# Patient Record
Sex: Male | Born: 2000 | Race: White | Hispanic: No | Marital: Single | Smoking: Never smoker
Health system: Southern US, Community
[De-identification: ages and names within clinical notes are randomized; demographics above are authoritative.]

---

## 2018-03-22 ENCOUNTER — Emergency Department: Payer: BLUE CROSS/BLUE SHIELD

## 2018-03-22 ENCOUNTER — Encounter: Payer: Self-pay | Admitting: *Deleted

## 2018-03-22 ENCOUNTER — Emergency Department
Admission: EM | Admit: 2018-03-22 | Discharge: 2018-03-22 | Disposition: A | Discharge status: Home / Self Care | Payer: BLUE CROSS/BLUE SHIELD | Attending: Emergency Medicine | Admitting: Emergency Medicine

## 2018-03-22 ENCOUNTER — Other Ambulatory Visit: Payer: Self-pay

## 2018-03-22 DIAGNOSIS — Y999 Unspecified external cause status: Secondary | ICD-10-CM | POA: Diagnosis not present

## 2018-03-22 DIAGNOSIS — Y9362 Activity, american flag or touch football: Secondary | ICD-10-CM | POA: Insufficient documentation

## 2018-03-22 DIAGNOSIS — Y92321 Football field as the place of occurrence of the external cause: Secondary | ICD-10-CM | POA: Insufficient documentation

## 2018-03-22 DIAGNOSIS — S99911A Unspecified injury of right ankle, initial encounter: Secondary | ICD-10-CM | POA: Diagnosis present

## 2018-03-22 DIAGNOSIS — X500XXA Overexertion from strenuous movement or load, initial encounter: Secondary | ICD-10-CM | POA: Insufficient documentation

## 2018-03-22 DIAGNOSIS — S93491A Sprain of other ligament of right ankle, initial encounter: Secondary | ICD-10-CM | POA: Insufficient documentation

## 2018-03-22 MED ORDER — MELOXICAM 15 MG PO TABS: 15.0000 mg | ORAL_TABLET | Freq: Every day | ORAL | 1 refills | Status: AC

## 2018-03-22 NOTE — ED Triage Notes (Signed)
Pt has right ankle pain.  Pt injured ankle today playing flag football.  Swelling to left ankle.  Pt alert.

## 2018-03-22 NOTE — ED Provider Notes (Signed)
[facility] Emergency Department Provider Note  ____________________________________________  Time seen: Approximately 10:33 PM  I have reviewed the triage vital signs and the nursing notes.   HISTORY  Chief Complaint Ankle Pain    HPI Mark Kent is a 17 y.o. male presents to the emergency department with right lateral ankle pain after sustaining an inversion type ankle injury while playing flag football.  Patient denies hitting his head during injury.  No numbness or tingling in the lower extremities.  No abrasions or lacerations.  Patient denies a history of prior right ankle sprains.  No alleviating measures have been attempted.   No past medical history on file.  There are no active problems to display for this patient.   Prior to Admission medications   Medication Sig Start Date End Date Taking? Authorizing Provider  meloxicam (MOBIC) 15 MG tablet Take 1 tablet (15 mg total) by mouth daily for 7 days. 03/22/18 03/29/18  Orvil Feil, PA-C    Allergies Patient has no known allergies.  No family history on file.  Social History Social History   Tobacco Use  . Smoking status: Never Smoker  . Smokeless tobacco: Never Used  Substance Use Topics  . Alcohol use: Never    Frequency: Never  . Drug use: Never     Review of Systems  Constitutional: No fever/chills Eyes: No visual changes. No discharge ENT: No upper respiratory complaints. Cardiovascular: no chest pain. Respiratory: no cough. No SOB. Gastrointestinal: No abdominal pain.  No nausea, no vomiting.  No diarrhea.  No constipation. Genitourinary: Negative for dysuria. No hematuria Musculoskeletal: Patient has right lateral ankle pain.  Skin: Negative for rash, abrasions, lacerations, ecchymosis. Neurological: Negative for headaches, focal weakness or numbness.   ____________________________________________   PHYSICAL EXAM:  VITAL SIGNS: ED Triage Vitals [03/22/18  2057]  Enc Vitals Group     BP 130/62     Pulse Rate (!) 106     Resp 18     Temp 98.7 F (37.1 C)     Temp Source Oral     SpO2 99 %     Weight 150 lb (68 kg)     Height 5\' 11"  (1.803 m)     Head Circumference      Peak Flow      Pain Score 8     Pain Loc      Pain Edu?      Excl. in GC?      Constitutional: Alert and oriented. Well appearing and in no acute distress. Eyes: Conjunctivae are normal. PERRL. EOMI. Head: Atraumatic. Cardiovascular: Normal rate, regular rhythm. Normal S1 and S2.  Good peripheral circulation. Respiratory: Normal respiratory effort without tachypnea or retractions. Lungs CTAB. Good air entry to the bases with no decreased or absent breath sounds. Gastrointestinal: Bowel sounds 4 quadrants. Soft and nontender to palpation. No guarding or rigidity. No palpable masses. No distention. No CVA tenderness. Musculoskeletal: Patient performs limited range of motion at the right ankle, likely secondary to pain.  Patient has significant swelling of the right lateral ankle.  Tenderness is elicited with palpation of the anterior talofibular ligament, right.  No pain with palpation over the deltoid ligament.  No pain with palpation over the calcaneus or with palpation of the right calf.  Palpable dorsalis pedis pulse, right. Neurologic:  Normal speech and language. No gross focal neurologic deficits are appreciated.  Skin:  Skin is warm, dry and intact. No rash noted. Psychiatric: Mood and affect are  normal. Speech and behavior are normal. Patient exhibits appropriate insight and judgement.   ____________________________________________   LABS (all labs ordered are listed, but only abnormal results are displayed)  Labs Reviewed - No data to display ____________________________________________  EKG   ____________________________________________  RADIOLOGY I personally viewed and evaluated these images as part of my medical decision making, as well as  reviewing the written report by the radiologist.  Dg Ankle Complete Right  Result Date: 03/22/2018 CLINICAL DATA:  Initial encounter for Pt states he was playing football and fell and another guys foot landed on his ankle. Right ankle pain and swelling. EXAM: RIGHT ANKLE - COMPLETE 3+ VIEW COMPARISON:  None. FINDINGS: Marked lateral malleolar soft tissue swelling. No acute fracture or dislocation. Base of fifth metatarsal and talar dome intact. There is soft tissue swelling extending anteriorly on the lateral view. IMPRESSION: Marked soft tissue swelling.  No acute osseous abnormality. Electronically Signed   By: Jeronimo GreavesKyle  Talbot M.D.   On: 03/22/2018 21:32    ____________________________________________    PROCEDURES  Procedure(s) performed:    Procedures    Medications - No data to display   ____________________________________________   INITIAL IMPRESSION / ASSESSMENT AND PLAN / ED COURSE  Pertinent labs & imaging results that were available during my care of the patient were reviewed by me and considered in my medical decision making (see chart for details).  Review of the  [place] was performed in accordance of the [facility] prior to dispensing any controlled drugs.      Assessment and Plan:  Right ankle sprain Patient presents to the emergency department with right lateral ankle pain after patient sustained an inversion type ankle injury.  No acute bony abnormalities were identified on x-ray examination of the right ankle.  An Ace wrap was applied in the emergency department and crutches were provided.  Rest, ice, compression and elevation were recommended.  Patient was discharged with meloxicam and a referral was given to orthopedics.  Vital signs are reassuring prior to discharge.  All patient questions were answered.    ____________________________________________  FINAL CLINICAL IMPRESSION(S) / ED DIAGNOSES  Final diagnoses:  Sprain of anterior talofibular ligament of  right ankle, initial encounter      NEW MEDICATIONS STARTED DURING THIS VISIT:  ED Discharge Orders         Ordered    meloxicam (MOBIC) 15 MG tablet  Daily     03/22/18 2229              This chart was dictated using voice recognition software/Dragon. Despite best efforts to proofread, errors can occur which can change the meaning. Any change was purely unintentional.    Orvil FeilWoods, Jaclyn M, PA-C 03/22/18 2238    Phineas SemenGoodman, Graydon, MD 03/22/18 2257

## 2020-01-19 IMAGING — CR DG ANKLE COMPLETE 3+V*R*
3 series · 3 of 3 positions shown · non-contrast
Comparison: None.

CLINICAL DATA: Initial encounter for Pt states he was playing
football and fell and another guys foot landed on his ankle. Right
ankle pain and swelling.

EXAM:
RIGHT ANKLE - COMPLETE 3+ VIEW

[ankle ap]
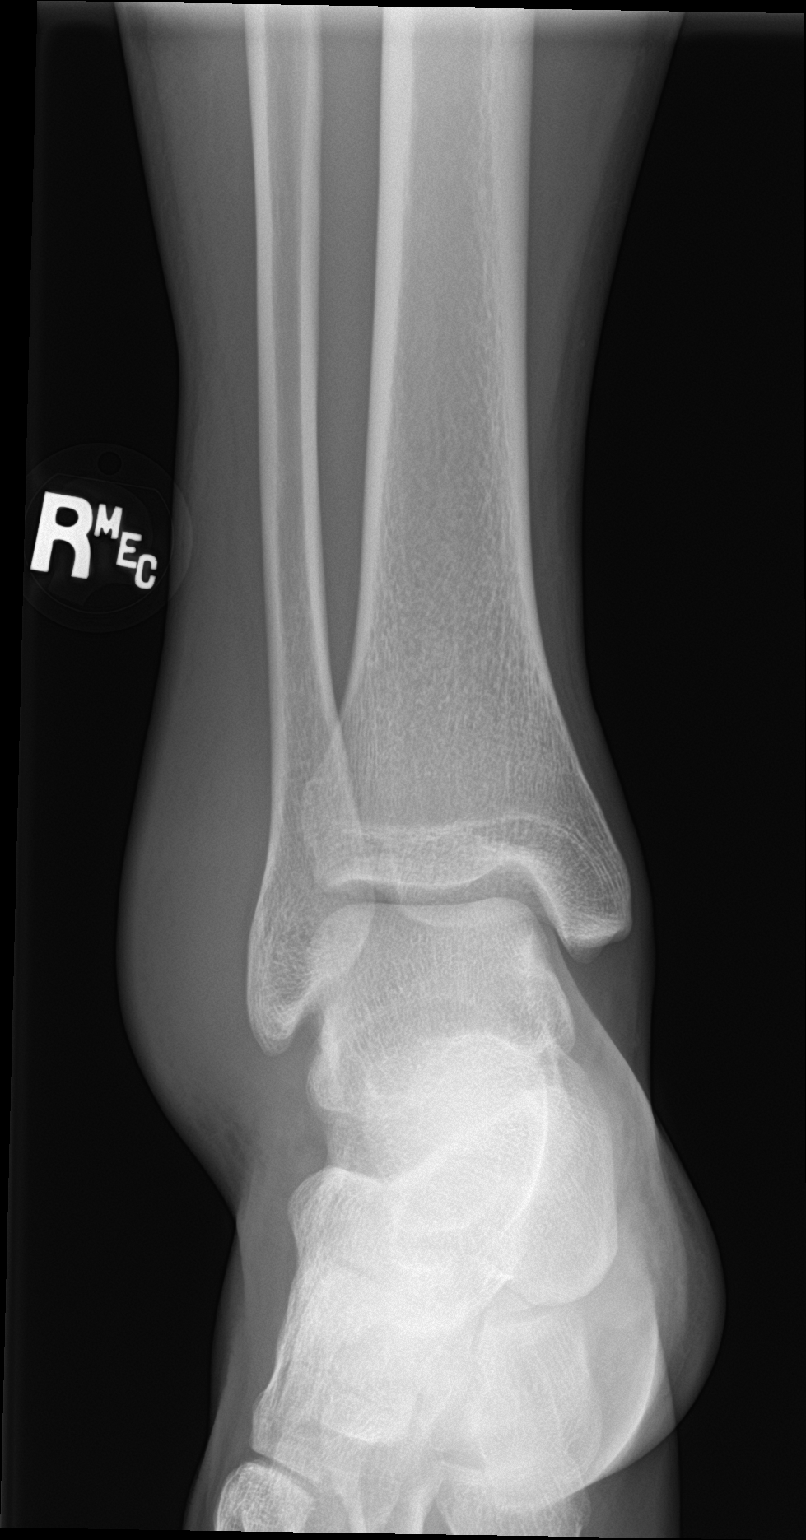

[ankle obl]
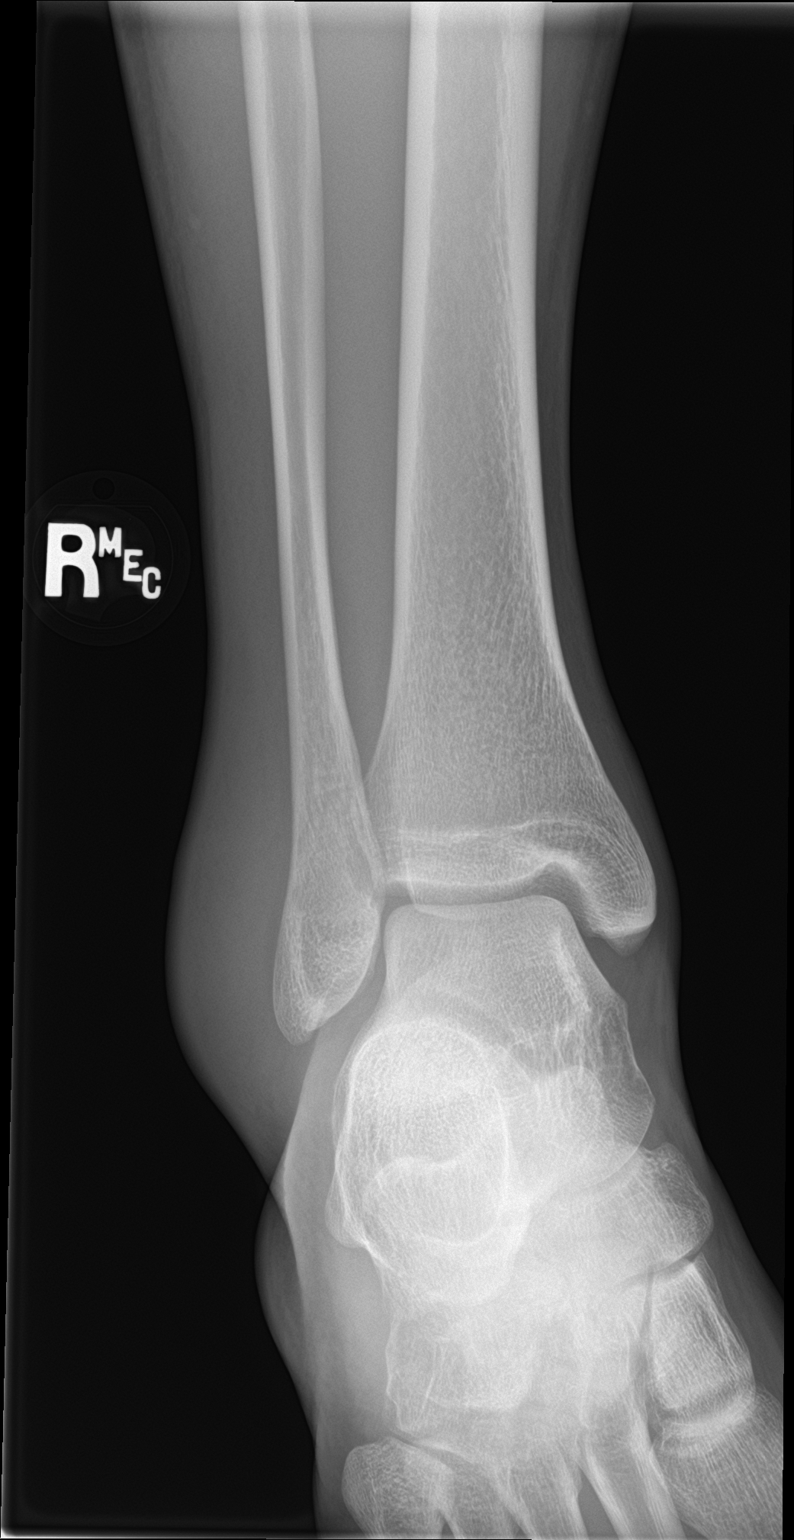

[ankle lat]
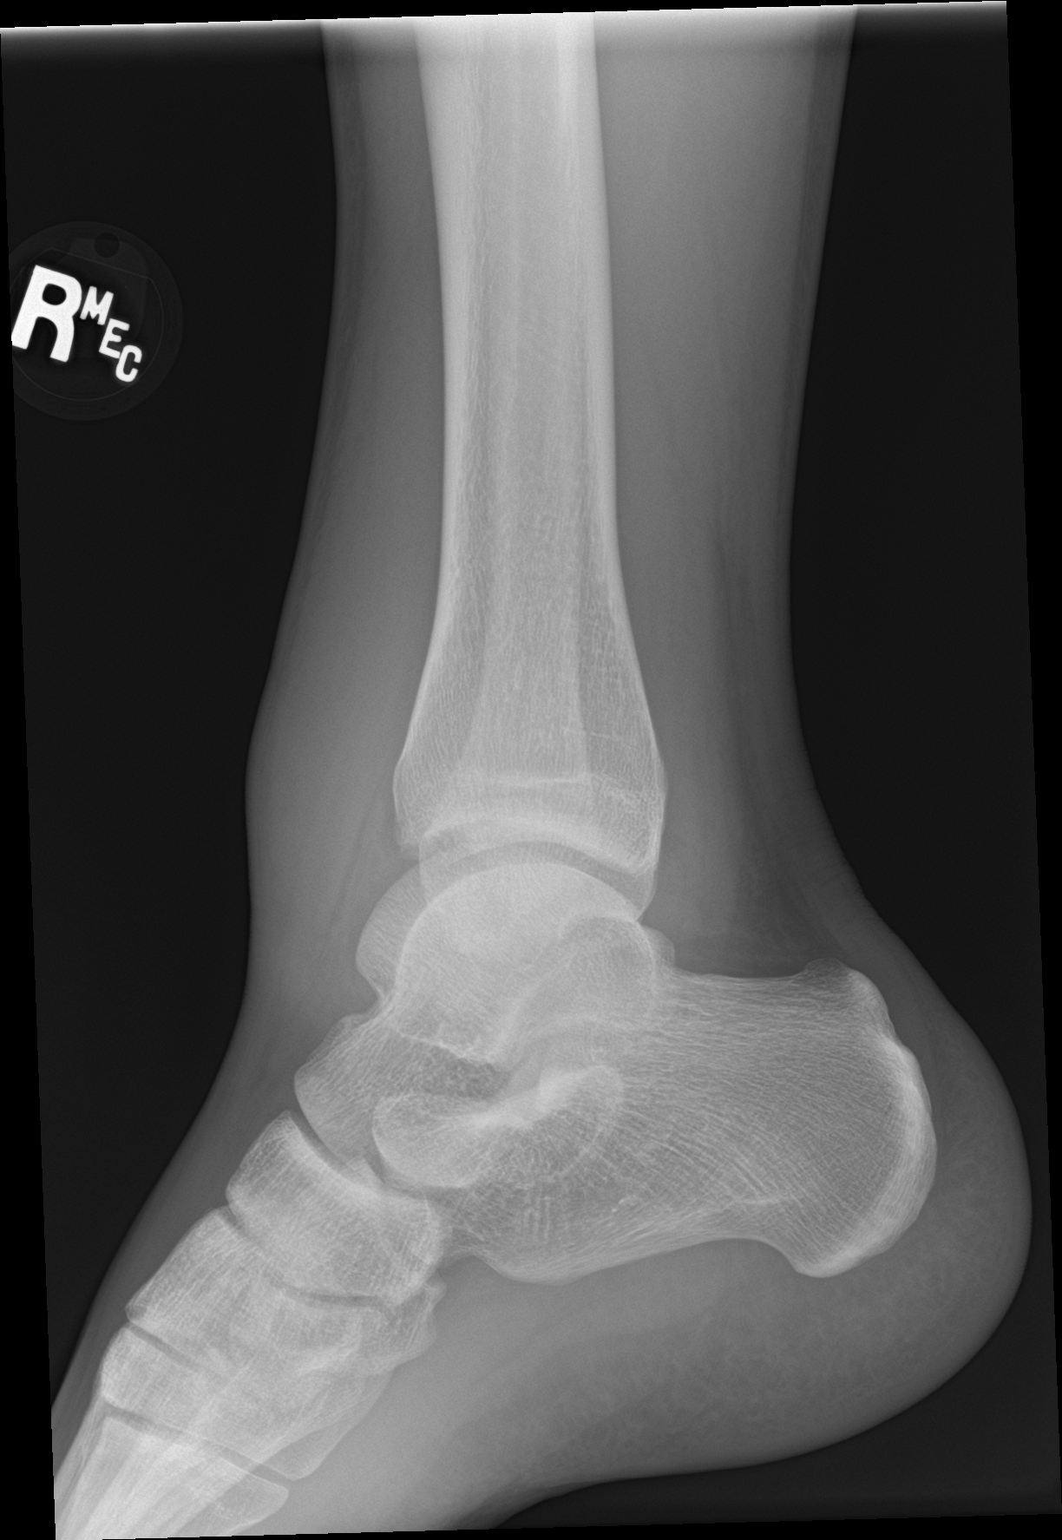

[3 of 3 positions shown; findings below may reference images not displayed]

FINDINGS: Marked lateral malleolar soft tissue swelling. No acute fracture or
dislocation. Base of fifth metatarsal and talar dome intact. There
is soft tissue swelling extending anteriorly on the lateral view.
IMPRESSION: Marked soft tissue swelling.  No acute osseous abnormality.
